# Patient Record
Sex: Male | Born: 1970 | Race: Black or African American | Hispanic: No | Marital: Single | Smoking: Never smoker
Health system: Southern US, Community
[De-identification: ages and names within clinical notes are randomized; demographics above are authoritative.]

---

## 2003-02-13 ENCOUNTER — Emergency Department (HOSPITAL_COMMUNITY): Admission: AD | Admit: 2003-02-13 | Discharge: 2003-02-13 | Payer: Self-pay | Admitting: Family Medicine

## 2003-02-18 ENCOUNTER — Emergency Department (HOSPITAL_COMMUNITY): Admission: AD | Admit: 2003-02-18 | Discharge: 2003-02-18 | Payer: Self-pay | Admitting: Family Medicine
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
## 2015-04-10 ENCOUNTER — Ambulatory Visit (INDEPENDENT_AMBULATORY_CARE_PROVIDER_SITE_OTHER): Payer: Self-pay | Admitting: Urgent Care

## 2015-04-10 VITALS — BP 120/80 | HR 63 | Temp 98.4°F | Resp 17 | Ht 73.0 in | Wt 211.0 lb

## 2015-04-10 DIAGNOSIS — Z024 Encounter for examination for driving license: Secondary | ICD-10-CM

## 2015-04-10 DIAGNOSIS — Z021 Encounter for pre-employment examination: Secondary | ICD-10-CM

## 2015-04-10 NOTE — Progress Notes (Signed)
Commercial Driver Medical Examination   Garrett Rivera is a 45 y.o. male who presents today for a DOT physical exam. The patient denies chronic medical conditions. He drinks 2-3 beers every other week.  The following portions of the patient's history were reviewed and updated as appropriate: allergies, current medications, past family history, past medical history, past social history and past surgical history.  Objective:   BP 120/80 mmHg  Pulse 63  Temp(Src) 98.4 F (36.9 C) (Oral)  Resp 17  Ht  (1.854 m)  Wt 211 lb (95.709 kg)  BMI 27.84 kg/m2  SpO2 98%  Vision/hearing:  Visual Acuity Screening   Right eye Left eye Both eyes  Without correction:  With correction:     Hearing Screening Comments: Peripheral Vision: Right eye 85 degrees. Left eye 85 degrees. The patient was able to hear a forced whisper from L=10, R=10 feet. The patient can distinguish the colors red, amber and green.  Applicant can recognize and distinguish among traffic control signals and devices showing standard red, green, and amber colors.  Corrective lenses required: No  Monocular Vision?: No  Hearing aid requirement: No  Physical Exam  Constitutional: He is oriented to person, place, and time. He appears well-developed and well-nourished.  HENT:  TM's intact bilaterally, no effusions or erythema. Nasal turbinates pink and moist, nasal passages patent. No sinus tenderness. Oropharynx clear, mucous membranes moist, dentition in good repair.  Eyes: Conjunctivae and EOM are normal. Pupils are equal, round, and reactive to light. Right eye exhibits no discharge. Left eye exhibits no discharge. No scleral icterus.  Neck: Normal range of motion. Neck supple. No thyromegaly present.  Cardiovascular: Normal rate, regular rhythm and intact distal pulses.  Exam reveals no gallop and no friction rub.   No murmur heard. Pulmonary/Chest: No stridor. No respiratory distress. He has no  wheezes. He has no rales.  Abdominal: Soft. Bowel sounds are normal. He exhibits no distension and no mass. There is no tenderness.  Musculoskeletal: Normal range of motion. He exhibits no edema or tenderness.  Strength 5/5.  Lymphadenopathy:    He has no cervical adenopathy.  Neurological: He is alert and oriented to person, place, and time. He has normal reflexes. Coordination normal.  Skin: Skin is warm and dry. No rash noted. No erythema. No pallor.  Psychiatric: He has a normal mood and affect.   Labs: Comments: Spgr:1.015. PRO:neg, Blood:neg, Sugar:neg  Assessment:    Healthy male exam.  Meets standards in 70 CFR 391.41;  qualifies for 2 year certificate.    Plan:   Medical examiners certificate completed and printed. Return as needed.
